# Patient Record
Sex: Female | Born: 2005 | Race: White | Hispanic: No | Marital: Single | Smoking: Current every day smoker
Health system: Southern US, Community
[De-identification: ages and names within clinical notes are randomized; demographics above are authoritative.]

## PROBLEM LIST (undated history)

## (undated) DIAGNOSIS — F909 Attention-deficit hyperactivity disorder, unspecified type: Secondary | ICD-10-CM

## (undated) DIAGNOSIS — F419 Anxiety disorder, unspecified: Secondary | ICD-10-CM

## (undated) DIAGNOSIS — D649 Anemia, unspecified: Secondary | ICD-10-CM

## (undated) DIAGNOSIS — F32A Depression, unspecified: Secondary | ICD-10-CM

## (undated) DIAGNOSIS — F319 Bipolar disorder, unspecified: Secondary | ICD-10-CM

## (undated) DIAGNOSIS — R51 Headache: Secondary | ICD-10-CM

## (undated) HISTORY — DX: Depression, unspecified: F32.A

## (undated) HISTORY — DX: Anxiety disorder, unspecified: F41.9

## (undated) HISTORY — DX: Headache: R51

## (undated) HISTORY — DX: Attention-deficit hyperactivity disorder, unspecified type: F90.9

## (undated) HISTORY — PX: DENTAL SURGERY: SHX609

## (undated) HISTORY — DX: Anemia, unspecified: D64.9

## (undated) HISTORY — DX: Bipolar disorder, unspecified: F31.9

---
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
## 2023-01-04 DIAGNOSIS — R109 Unspecified abdominal pain: Secondary | ICD-10-CM | POA: Diagnosis not present

## 2023-01-04 DIAGNOSIS — R1084 Generalized abdominal pain: Secondary | ICD-10-CM | POA: Diagnosis not present

## 2023-01-04 DIAGNOSIS — Z3202 Encounter for pregnancy test, result negative: Secondary | ICD-10-CM | POA: Diagnosis not present

## 2023-01-04 DIAGNOSIS — R102 Pelvic and perineal pain: Secondary | ICD-10-CM | POA: Diagnosis not present

## 2023-05-10 DIAGNOSIS — R109 Unspecified abdominal pain: Secondary | ICD-10-CM | POA: Diagnosis not present

## 2023-05-10 DIAGNOSIS — R1031 Right lower quadrant pain: Secondary | ICD-10-CM | POA: Diagnosis not present

## 2023-06-04 DIAGNOSIS — R8290 Unspecified abnormal findings in urine: Secondary | ICD-10-CM | POA: Diagnosis not present

## 2023-06-04 DIAGNOSIS — N3000 Acute cystitis without hematuria: Secondary | ICD-10-CM | POA: Diagnosis not present

## 2023-06-04 DIAGNOSIS — R059 Cough, unspecified: Secondary | ICD-10-CM | POA: Diagnosis not present

## 2023-07-27 ENCOUNTER — Ambulatory Visit: Payer: Self-pay | Admitting: Advanced Practice Midwife

## 2023-09-13 DIAGNOSIS — B9689 Other specified bacterial agents as the cause of diseases classified elsewhere: Secondary | ICD-10-CM | POA: Diagnosis not present

## 2023-09-13 DIAGNOSIS — J069 Acute upper respiratory infection, unspecified: Secondary | ICD-10-CM | POA: Diagnosis not present

## 2023-09-13 DIAGNOSIS — J028 Acute pharyngitis due to other specified organisms: Secondary | ICD-10-CM | POA: Diagnosis not present

## 2023-12-05 ENCOUNTER — Encounter (INDEPENDENT_AMBULATORY_CARE_PROVIDER_SITE_OTHER): Payer: Self-pay

## 2023-12-06 ENCOUNTER — Encounter (INDEPENDENT_AMBULATORY_CARE_PROVIDER_SITE_OTHER): Payer: Self-pay
